# Patient Record
Sex: Female | Born: 1965
Health system: Southern US, Community
[De-identification: ages and names within clinical notes are randomized; demographics above are authoritative.]

## PROBLEM LIST (undated history)

## (undated) DIAGNOSIS — E119 Type 2 diabetes mellitus without complications: Secondary | ICD-10-CM

## (undated) DIAGNOSIS — I639 Cerebral infarction, unspecified: Secondary | ICD-10-CM

## (undated) DIAGNOSIS — E7800 Pure hypercholesterolemia, unspecified: Secondary | ICD-10-CM

## (undated) DIAGNOSIS — I10 Essential (primary) hypertension: Secondary | ICD-10-CM

## (undated) HISTORY — PX: TONSILLECTOMY: SUR1361

## (undated) HISTORY — PX: TUBAL LIGATION: SHX77

---

## 2000-05-20 ENCOUNTER — Other Ambulatory Visit: Admission: RE | Admit: 2000-05-20 | Discharge: 2000-05-20 | Payer: Self-pay | Admitting: Family Medicine

## 2011-01-29 ENCOUNTER — Encounter: Payer: Self-pay | Admitting: *Deleted

## 2011-01-29 ENCOUNTER — Emergency Department (HOSPITAL_BASED_OUTPATIENT_CLINIC_OR_DEPARTMENT_OTHER)
Admission: EM | Admit: 2011-01-29 | Discharge: 2011-01-29 | Disposition: A | Discharge status: Home / Self Care | Payer: BC Managed Care – PPO | Attending: Emergency Medicine | Admitting: Emergency Medicine

## 2011-01-29 DIAGNOSIS — M62838 Other muscle spasm: Secondary | ICD-10-CM

## 2011-01-29 DIAGNOSIS — M25519 Pain in unspecified shoulder: Secondary | ICD-10-CM | POA: Insufficient documentation

## 2011-01-29 DIAGNOSIS — I10 Essential (primary) hypertension: Secondary | ICD-10-CM | POA: Insufficient documentation

## 2011-01-29 DIAGNOSIS — M549 Dorsalgia, unspecified: Secondary | ICD-10-CM | POA: Insufficient documentation

## 2011-01-29 DIAGNOSIS — Z79899 Other long term (current) drug therapy: Secondary | ICD-10-CM | POA: Insufficient documentation

## 2011-01-29 HISTORY — DX: Essential (primary) hypertension: I10

## 2011-01-29 MED ORDER — IBUPROFEN 600 MG PO TABS: 600.0000 mg | ORAL_TABLET | Freq: Three times a day (TID) | ORAL | Status: AC | PRN

## 2011-01-29 MED ORDER — KETOROLAC TROMETHAMINE 60 MG/2ML IM SOLN
60.0000 mg | Freq: Once | INTRAMUSCULAR | Status: AC
  Administered 2011-01-29: 60 mg via INTRAMUSCULAR
  Filled 2011-01-29: qty 2

## 2011-01-29 MED ORDER — DIAZEPAM 5 MG PO TABS: 5.0000 mg | ORAL_TABLET | Freq: Two times a day (BID) | ORAL | Status: AC

## 2011-01-29 MED ORDER — HYDROCODONE-ACETAMINOPHEN 5-500 MG PO TABS: 1.0000 | ORAL_TABLET | Freq: Four times a day (QID) | ORAL | Status: AC | PRN

## 2011-01-29 NOTE — ED Provider Notes (Signed)
History     CSN: [id number] Arrival date & time: 01/29/2011  3:40 PM   First MD Initiated Contact with Patient 01/29/11 1542      Chief Complaint  Patient presents with  . Shoulder Pain    (Consider location/radiation/quality/duration/timing/severity/associated sxs/prior treatment) HPI Patient reports awakening from bed 2 days ago with a soreness in her left shoulder.  Has been constant since then.  She's not tried anything for the pain.  She denies neck pain.  She denies chest pain shortness of breath.  She denies weakness of her left upper extremity.  She has no numbness or tingling.  She reports her pain is worsened by palpation and movement of her left arm.  She's had no recent trauma.    Past Medical History  Diagnosis Date  . Hypertension     Past Surgical History  Procedure Date  . Tubal ligation     History reviewed. No pertinent family history.  History  Substance Use Topics  . Smoking status: Never Smoker   . Smokeless tobacco: Not on file  . Alcohol Use: No    OB History    Grav Para Term Preterm Abortions TAB SAB Ect Mult Living                  Review of Systems  All other systems reviewed and are negative.    Allergies  Review of patient's allergies indicates no known allergies.  Home Medications   Current Outpatient Rx  Name Route Sig Dispense Refill  . AMLODIPINE BESY-BENAZEPRIL HCL 10-20 MG PO CAPS Oral Take 1 capsule by mouth daily.      . ASPIRIN 325 MG PO TABS Oral Take 325 mg by mouth daily.      Marland Kitchen HYDROCHLOROTHIAZIDE 25 MG PO TABS Oral Take 25 mg by mouth daily.      Marland Kitchen DIAZEPAM 5 MG PO TABS Oral Take 1 tablet (5 mg total) by mouth 2 (two) times daily. 10 tablet 0  . HYDROCODONE-ACETAMINOPHEN 5-500 MG PO TABS Oral Take 1-2 tablets by mouth every 6 (six) hours as needed for pain. 15 tablet 0  . IBUPROFEN 600 MG PO TABS Oral Take 1 tablet (600 mg total) by mouth every 8 (eight) hours as needed for pain. 15 tablet 0    BP 152/72   Pulse 93  Temp(Src) 98.6 F (37 C) (Oral)  Resp 16  Ht 5\' 6"  (1.676 m)  Wt 240 lb (108.863 kg)  BMI 38.74 kg/m2  SpO2 100%  LMP 01/12/2011  Physical Exam  Nursing note and vitals reviewed. Constitutional: She is oriented to person, place, and time. She appears well-developed and well-nourished.  HENT:  Head: Normocephalic.  Eyes: EOM are normal.  Neck: Normal range of motion.  Cardiovascular: Normal rate.   Pulmonary/Chest: Effort normal.  Abdominal: Soft.  Musculoskeletal: Normal range of motion.       Patient with spasm of her left parathoracic region.  There is no overlying skin changes.  She is tender to palpation in this area and there is a notable spasm.  She has normal strength in her bilateral upper extremities.  She has normal range of motion of her left shoulder.  There is no bruising  Neurological: She is alert and oriented to person, place, and time.  Skin: Skin is warm and dry.  Psychiatric: She has a normal mood and affect.    ED Course  Procedures (including critical care time)  Labs Reviewed - No data to display No results  found.   1. Back pain   2. Muscle spasm       MDM  No trauma.  Patient has apparent muscle spasm.  Plan Toradol given in the emergency department she'll be discharged home on hydrocodone and muscle relaxants.  Close followup with her PCP is recommended        Lyanne Co, MD 01/29/11 1558

## 2011-01-29 NOTE — ED Notes (Signed)
Pt c/o left shoulder pain X 2 days

## 2016-02-14 ENCOUNTER — Encounter (HOSPITAL_BASED_OUTPATIENT_CLINIC_OR_DEPARTMENT_OTHER): Payer: Self-pay | Admitting: Emergency Medicine

## 2016-02-14 ENCOUNTER — Emergency Department (HOSPITAL_BASED_OUTPATIENT_CLINIC_OR_DEPARTMENT_OTHER)
Admission: EM | Admit: 2016-02-14 | Discharge: 2016-02-14 | Disposition: A | Discharge status: Home / Self Care | Payer: 59 | Attending: Emergency Medicine | Admitting: Emergency Medicine

## 2016-02-14 DIAGNOSIS — E119 Type 2 diabetes mellitus without complications: Secondary | ICD-10-CM | POA: Diagnosis not present

## 2016-02-14 DIAGNOSIS — Z7982 Long term (current) use of aspirin: Secondary | ICD-10-CM | POA: Diagnosis not present

## 2016-02-14 DIAGNOSIS — K029 Dental caries, unspecified: Secondary | ICD-10-CM | POA: Insufficient documentation

## 2016-02-14 DIAGNOSIS — K0889 Other specified disorders of teeth and supporting structures: Secondary | ICD-10-CM

## 2016-02-14 DIAGNOSIS — Z7984 Long term (current) use of oral hypoglycemic drugs: Secondary | ICD-10-CM | POA: Insufficient documentation

## 2016-02-14 DIAGNOSIS — I10 Essential (primary) hypertension: Secondary | ICD-10-CM | POA: Insufficient documentation

## 2016-02-14 HISTORY — DX: Pure hypercholesterolemia, unspecified: E78.00

## 2016-02-14 HISTORY — DX: Cerebral infarction, unspecified: I63.9

## 2016-02-14 HISTORY — DX: Type 2 diabetes mellitus without complications: E11.9

## 2016-02-14 MED ORDER — PENICILLIN V POTASSIUM 500 MG PO TABS: 500.0000 mg | ORAL_TABLET | Freq: Four times a day (QID) | ORAL | 0 refills | Status: AC

## 2016-02-14 MED ORDER — HYDROCODONE-ACETAMINOPHEN 5-325 MG PO TABS: 1.0000 | ORAL_TABLET | Freq: Four times a day (QID) | ORAL | 0 refills | Status: AC | PRN

## 2016-02-14 MED FILL — PENICILLIN VK 500 MG TABLET: 500 | 7 days supply | Qty: 28 | Fill #0

## 2016-02-14 MED FILL — HYDROCODON-APAP 5-325: 5-325 | 2 days supply | Qty: 10 | Fill #0

## 2016-02-14 NOTE — Discharge Instructions (Signed)
Take the penicillin antibiotic as directed for 7 days. Take the hydrocodone as needed for pain. Make an appointment to follow-up with your dentist.

## 2016-02-14 NOTE — ED Triage Notes (Signed)
R side back tooth ache x 2 days. Taking ibuprofen without relief. Last dose 6:00 this morning.

## 2016-02-14 NOTE — ED Provider Notes (Signed)
[facility] Provider [facility]   CSN: [id number] Arrival date & time: 02/14/16  [id number]     History   Chief Complaint Chief Complaint  Patient presents with  . Dental Pain    HPI Denise Meyer is a 50 y.o. female.  Patient with 2 day history of right side upper toothache. Patient with history of dental problems. Patient's been taking ibuprofen without relief. Patient without difficulty swallowing. No fevers. Tooth pain is 10 out of 10. Patient does have a dentist to follow-up with. Patient has a history of allergies to cephalosporins but is able to take penicillin.      Past Medical History:  Diagnosis Date  . Diabetes mellitus without complication (HCC)   . High cholesterol   . Hypertension   . Stroke [facility](HCC)     There are no active problems to display for this patient.   Past Surgical History:  Procedure Laterality Date  . TUBAL LIGATION      OB History    No data available       Home Medications    Prior to Admission medications   Medication Sig Start Date End Date Taking? Authorizing Provider  atorvastatin (LIPITOR) 10 MG tablet Take 10 mg by mouth daily.   Yes Historical Provider, MD  clopidogrel (PLAVIX) 75 MG tablet Take 75 mg by mouth daily.   Yes Historical Provider, MD  metFORMIN (GLUCOPHAGE) 500 MG tablet Take by mouth 2 (two) times daily with a meal.   Yes Historical Provider, MD  amLODipine-benazepril (LOTREL) 10-20 MG per capsule Take 1 capsule by mouth daily.      Historical Provider, MD  aspirin 325 MG tablet Take 325 mg by mouth daily.      Historical Provider, MD  hydrochlorothiazide (HYDRODIURIL) 25 MG tablet Take 25 mg by mouth daily.      Historical Provider, MD  HYDROcodone-acetaminophen (NORCO/VICODIN) 5-325 MG tablet Take 1-2 tablets by mouth every 6 (six) hours as needed for moderate pain. 02/14/16   Vanetta MuldersScott Lavon Horn, MD  penicillin v potassium (VEETID) 500 MG tablet Take 1 tablet (500 mg total) by mouth 4 (four) times daily.  02/14/16   Vanetta MuldersScott Jullianna Gabor, MD    Family History No family history on file.  Social History Social History  Substance Use Topics  . Smoking status: Never Smoker  . Smokeless tobacco: Never Used  . Alcohol use No     Allergies   Cephalosporins   Review of Systems Review of Systems  Constitutional: Negative for fever.  HENT: Positive for dental problem. Negative for facial swelling and trouble swallowing.   Eyes: Negative for redness.  Respiratory: Negative for shortness of breath.   Cardiovascular: Negative for chest pain.  Gastrointestinal: Negative for abdominal pain.  Musculoskeletal: Negative for neck stiffness.  Skin: Negative for rash.  Neurological: Negative for headaches.  Hematological: Does not bruise/bleed easily.  Psychiatric/Behavioral: Negative for confusion.     Physical Exam Updated Vital Signs BP 162/86 (BP Location: Left Arm)   Pulse 76   Temp 98.6 F (37 C) (Oral)   Resp 16   LMP 02/01/2016   SpO2 95%   Physical Exam  Constitutional: She is oriented to person, place, and time. She appears well-developed and well-nourished. No distress.  HENT:  Head: Normocephalic.  Mouth/Throat: Oropharynx is clear and moist. No oropharyngeal exudate.  Marked dental caries. Significant Cary in right upper molar. No significant facial swelling. No adenopathy. No trismus.  Eyes: Conjunctivae and EOM are normal. Pupils are equal,  round, and reactive to light.  Neck: Normal range of motion. Neck supple.  Cardiovascular: Normal rate, regular rhythm and normal heart sounds.   Pulmonary/Chest: Effort normal and breath sounds normal. No respiratory distress.  Abdominal: Soft. Bowel sounds are normal.  Lymphadenopathy:    She has no cervical adenopathy.  Neurological: She is alert and oriented to person, place, and time. No cranial nerve deficit.  Skin: Skin is warm.  Nursing note and vitals reviewed.    ED Treatments / Results  Labs (all labs ordered are  listed, but only abnormal results are displayed) Labs Reviewed - No data to display  EKG  EKG Interpretation None       Radiology No results found.  Procedures Procedures (including critical care time)  Medications Ordered in ED Medications - No data to display   Initial Impression / Assessment and Plan / ED Course  I have reviewed the triage vital signs and the nursing notes.  Pertinent labs & imaging results that were available during my care of the patient were reviewed by me and considered in my medical decision making (see chart for details).  Clinical Course     Patient with significant decay to right upper molar most likely source of the pain. Patient nontoxic no acute distress. Patient is able to take penicillin despite having allergy to cephalosporins. Will treat with Pen-Vee K and hydrocodone for the pain. Patient does have a dentist to follow-up with. Work note provided.  Final Clinical Impressions(s) / ED Diagnoses   Final diagnoses:  Toothache  Dental caries    New Prescriptions New Prescriptions   HYDROCODONE-ACETAMINOPHEN (NORCO/VICODIN) 5-325 MG TABLET    Take 1-2 tablets by mouth every 6 (six) hours as needed for moderate pain.   PENICILLIN V POTASSIUM (VEETID) 500 MG TABLET    Take 1 tablet (500 mg total) by mouth 4 (four) times daily.     Vanetta MuldersScott Laiana Fratus, MD 02/14/16 1005

## 2017-08-14 ENCOUNTER — Other Ambulatory Visit: Payer: Self-pay

## 2017-08-14 ENCOUNTER — Emergency Department (HOSPITAL_BASED_OUTPATIENT_CLINIC_OR_DEPARTMENT_OTHER)
Admission: EM | Admit: 2017-08-14 | Discharge: 2017-08-14 | Disposition: A | Discharge status: Home / Self Care | Payer: BLUE CROSS/BLUE SHIELD | Attending: Emergency Medicine | Admitting: Emergency Medicine

## 2017-08-14 ENCOUNTER — Encounter (HOSPITAL_BASED_OUTPATIENT_CLINIC_OR_DEPARTMENT_OTHER): Payer: Self-pay | Admitting: *Deleted

## 2017-08-14 DIAGNOSIS — E119 Type 2 diabetes mellitus without complications: Secondary | ICD-10-CM | POA: Insufficient documentation

## 2017-08-14 DIAGNOSIS — Z7984 Long term (current) use of oral hypoglycemic drugs: Secondary | ICD-10-CM | POA: Insufficient documentation

## 2017-08-14 DIAGNOSIS — L03012 Cellulitis of left finger: Secondary | ICD-10-CM

## 2017-08-14 DIAGNOSIS — Z7902 Long term (current) use of antithrombotics/antiplatelets: Secondary | ICD-10-CM | POA: Insufficient documentation

## 2017-08-14 DIAGNOSIS — Z7982 Long term (current) use of aspirin: Secondary | ICD-10-CM | POA: Insufficient documentation

## 2017-08-14 DIAGNOSIS — M79642 Pain in left hand: Secondary | ICD-10-CM | POA: Diagnosis present

## 2017-08-14 DIAGNOSIS — I10 Essential (primary) hypertension: Secondary | ICD-10-CM | POA: Diagnosis not present

## 2017-08-14 DIAGNOSIS — Z79899 Other long term (current) drug therapy: Secondary | ICD-10-CM | POA: Insufficient documentation

## 2017-08-14 MED ORDER — DOXYCYCLINE HYCLATE 100 MG PO CAPS: 100.0000 mg | ORAL_CAPSULE | Freq: Two times a day (BID) | ORAL | 0 refills | Status: AC

## 2017-08-14 MED ORDER — LIDOCAINE-EPINEPHRINE-TETRACAINE (LET) SOLUTION
3.0000 mL | Freq: Once | NASAL | Status: AC
  Administered 2017-08-14: 3 mL via TOPICAL
  Filled 2017-08-14: qty 3

## 2017-08-14 MED ORDER — NAPROXEN 375 MG PO TABS: 375.0000 mg | ORAL_TABLET | Freq: Two times a day (BID) | ORAL | 0 refills | Status: AC

## 2017-08-14 MED FILL — DOXYCYCLINE HYCLATE 100 MG: 100 | 5 days supply | Qty: 10 | Fill #0

## 2017-08-14 MED FILL — NAPROXEN 375 MG TABLET: 375 | 10 days supply | Qty: 20 | Fill #0

## 2017-08-14 NOTE — ED Provider Notes (Signed)
[facility] EMERGENCY DEPARTMENT Provider Note   CSN: [id number] Arrival date & time: 08/14/17  1518     History   Chief Complaint Chief Complaint  Patient presents with  . Hand Pain    HPI Denise Meyer is a 52 y.o. female with a hx of DM, HTN, hyperlipidemia, and stroke who presents to the ED for left index finger pain and swelling x 1 week. Patient states constant, progressive worsening pain. Rates pain an 8/10 in severity, worse with palpation, no other alleviating/aggravating factors. Has not tried medications at home. No hx of similar. Denies fever, chills, numbness, or weakness. No injury or change in activity. Patient is diabetic- glucose at home running 120-130 per patient.Patient is R hand dominant.  Patient reports tetanus is up to date.   Patient with hx of HTN, taking meds as prescribed- no HA, change in vision, numbness, weakness, chest pain, or dyspnea.   HPI  Past Medical History:  Diagnosis Date  . Diabetes mellitus without complication (HCC)   . High cholesterol   . Hypertension   . Stroke [facility](HCC)     There are no active problems to display for this patient.   Past Surgical History:  Procedure Laterality Date  . TUBAL LIGATION       OB History   None      Home Medications    Prior to Admission medications   Medication Sig Start Date End Date Taking? Authorizing Provider  amLODipine-benazepril (LOTREL) 10-20 MG per capsule Take 1 capsule by mouth daily.      [provider]  aspirin 325 MG tablet Take 325 mg by mouth daily.      [provider]  atorvastatin (LIPITOR) 10 MG tablet Take 10 mg by mouth daily.    [provider]  clopidogrel (PLAVIX) 75 MG tablet Take 75 mg by mouth daily.    [provider]  hydrochlorothiazide (HYDRODIURIL) 25 MG tablet Take 25 mg by mouth daily.      [provider]  HYDROcodone-acetaminophen (NORCO/VICODIN) 5-325 MG tablet Take 1-2 tablets by mouth every 6  (six) hours as needed for moderate pain. 02/14/16   Vanetta MuldersZackowski, Scott, MD  metFORMIN (GLUCOPHAGE) 500 MG tablet Take by mouth 2 (two) times daily with a meal.    [provider]  penicillin v potassium (VEETID) 500 MG tablet Take 1 tablet (500 mg total) by mouth 4 (four) times daily. 02/14/16   Vanetta MuldersZackowski, Scott, MD    Family History History reviewed. No pertinent family history.  Social History Social History   Tobacco Use  . Smoking status: Never Smoker  . Smokeless tobacco: Never Used  Substance Use Topics  . Alcohol use: No  . Drug use: No     Allergies   Cephalosporins   Review of Systems Review of Systems  Constitutional: Negative for chills and fever.  Eyes: Negative for visual disturbance.  Respiratory: Negative for shortness of breath.   Cardiovascular: Negative for chest pain.  Musculoskeletal:       Positive for L index finger pain/swelling   Neurological: Negative for weakness, numbness and headaches.   Physical Exam Updated Vital Signs BP (!) 172/81 (BP Location: Left Arm)   Pulse 89   Temp 98.6 F (37 C) (Oral)   Resp 18   Ht 5\' 6"  (1.676 m)   Wt 108.9 kg (240 lb)   LMP 07/31/2017   SpO2 100%   BMI 38.74 kg/m   Physical Exam  Constitutional: She appears  well-developed and well-nourished. No distress.  HENT:  Head: Normocephalic and atraumatic.  Eyes: Conjunctivae are normal. Right eye exhibits no discharge. Left eye exhibits no discharge.  Cardiovascular:  Pulses:      Radial pulses are 2+ on the right side, and 2+ on the left side.  Musculoskeletal:  L 2nd digit: There is some soft tissue swelling to the lateral (radial aspect) of the nail with palpable fluctuance just proximal to the cuticle on this side. This area is tender to palpation. Mild overlying erythema. Swelling/erythema extend to lateral aspect of the finger, does not extend to the pulp. The pulp of the finger is soft, no overlying erythema/induration/fluctuance to the pulp.    Upper extremities: no obvious deformities. Patient has normal ROM to bilateral wrists and all digits including L 2nd digit. Other than what is noted to above, nontender to palpation.   Neurological: She is alert.  Clear speech. Sensation grossly intact to bilateral upper extremities. 5/5 symmetric grip strength. Able to perform OK sign, thumbs up, and cross 2nd/3rd digits.   Skin: Capillary refill takes less than 2 seconds.  Psychiatric: She has a normal mood and affect. Her behavior is normal. Thought content normal.  Nursing note and vitals reviewed.   ED Treatments / Results  Labs (all labs ordered are listed, but only abnormal results are displayed) Labs Reviewed - No data to display  EKG None  Radiology No results found.  Procedures .Marland KitchenIncision and Drainage Date/Time: 08/14/2017 4:25 PM Performed by: Cherly Anderson, PA-C Authorized by: Cherly Anderson, PA-C   Consent:    Consent obtained:  Verbal   Consent given by:  Patient   Risks discussed:  Bleeding, incomplete drainage, infection, pain and damage to other organs   Alternatives discussed:  No treatment and alternative treatment Location:    Type:  Abscess (paronychia)   Location:  Upper extremity   Upper extremity location:  Finger   Finger location:  L index finger Pre-procedure details:    Skin preparation:  Betadine Anesthesia (see MAR for exact dosages):    Anesthesia method:  Topical application   Topical anesthetic:  LET Procedure type:    Complexity:  Simple Procedure details:    Incision types:  Stab incision   Scalpel blade:  11   Drainage:  Purulent   Drainage amount:  Moderate   Wound treatment:  Wound left open   Packing materials:  None Post-procedure details:    Patient tolerance of procedure:  Tolerated well, no immediate complications   (including critical care time)  Medications Ordered in ED Medications  lidocaine-EPINEPHrine-tetracaine (LET) solution (3 mLs Topical  Given 08/14/17 1545)   Initial Impression / Assessment and Plan / ED Course  I have reviewed the triage vital signs and the nursing notes.  Pertinent labs & imaging results that were available during my care of the patient were reviewed by me and considered in my medical decision making (see chart for details).   Patient presents with H&P consistent with paronychia to the radial aspect of the L 2nd digit. No appreciable felon on exam. I&D performed per procedure note above. Moderate amount of drainage. Will place on short course of doxycycline. Recommended warm soaks/compresses. Will given naproxen for pain, last creatinine on chart review WNL. Will have patient follow up with PCP for recheck of digit as well as BP recheck in 2-3 days given BP elevated in the ER (has been elevated during past ER visits)- doubt HTN emergency, she is aware of  need for recheck. I discussed treatment plan, need for PCP follow-up, and return precautions with the patient. Provided opportunity for questions, patient confirmed understanding and is in agreement with plan.    Final Clinical Impressions(s) / ED Diagnoses   Final diagnoses:  Paronychia of finger, left    ED Discharge Orders        Ordered    naproxen (NAPROSYN) 375 MG tablet  2 times daily     08/14/17 1631    doxycycline (VIBRAMYCIN) 100 MG capsule  2 times daily     08/14/17 1631       Petrucelli, [place] R, PA-C 08/14/17 1743    Little, Ambrose Finland, MD 08/14/17 1755

## 2017-08-14 NOTE — ED Triage Notes (Signed)
Pt c/o left index finger pain and redness x 1 week

## 2017-08-14 NOTE — Discharge Instructions (Signed)
You were seen in the emergency department for pain and swelling to your left index finger.  You have a paronychia of this finger which was incised and drained, please see the attached handout regarding further information for this diagnosis.  We are also start you on doxycycline, and antibiotic, to treat the infection as well.  Please take this as prescribed. Please take all of your antibiotics until finished. You may develop abdominal discomfort or diarrhea from the antibiotic.  You may help offset this with probiotics which you can buy at the store (ask your pharmacist if unable to find) or get probiotics in the form of eating yogurt. Do not eat or take the probiotics until 2 hours after your antibiotic. If you are unable to tolerate these side effects follow-up with your primary care provider or return to the emergency department.   If you begin to experience any blistering, rashes, swelling, or difficulty breathing seek medical care for evaluation of potentially more serious side effects.   Please be aware that this medication may interact with other medications you are taking, please be sure to discuss your medication list with your pharmacist.   We are sending you home with a prescription for naproxen for pain. Naproxen is a nonsteroidal anti-inflammatory medication that will help with pain and swelling. Be sure to take this medication as prescribed with food, 1 pill every 12 hours,  It should be taken with food, as it can cause stomach upset, and more seriously, stomach bleeding. Do not take other nonsteroidal anti-inflammatory medications with this such as Advil, Motrin, or Aleve.    Additionally apply warm compresses or utilize warm soaks to 5 times per day.  Follow-up with your primary care provider in 2 to 3 days for recheck of this area and for a recheck of your blood pressure as it was elevated in the emergency department today.  Return to the ER for new or worsening symptoms including but not  limited to spreading redness, fevers, vomiting, or any other concerns.

## 2020-07-05 ENCOUNTER — Emergency Department (HOSPITAL_BASED_OUTPATIENT_CLINIC_OR_DEPARTMENT_OTHER): Payer: Worker's Compensation

## 2020-07-05 ENCOUNTER — Encounter (HOSPITAL_BASED_OUTPATIENT_CLINIC_OR_DEPARTMENT_OTHER): Payer: Self-pay | Admitting: *Deleted

## 2020-07-05 ENCOUNTER — Other Ambulatory Visit: Payer: Self-pay

## 2020-07-05 ENCOUNTER — Emergency Department (HOSPITAL_BASED_OUTPATIENT_CLINIC_OR_DEPARTMENT_OTHER)
Admission: EM | Admit: 2020-07-05 | Discharge: 2020-07-05 | Disposition: A | Discharge status: Home / Self Care | Payer: Worker's Compensation | Attending: Emergency Medicine | Admitting: Emergency Medicine

## 2020-07-05 DIAGNOSIS — I10 Essential (primary) hypertension: Secondary | ICD-10-CM | POA: Diagnosis not present

## 2020-07-05 DIAGNOSIS — Z7984 Long term (current) use of oral hypoglycemic drugs: Secondary | ICD-10-CM | POA: Insufficient documentation

## 2020-07-05 DIAGNOSIS — Z7982 Long term (current) use of aspirin: Secondary | ICD-10-CM | POA: Insufficient documentation

## 2020-07-05 DIAGNOSIS — Y92410 Unspecified street and highway as the place of occurrence of the external cause: Secondary | ICD-10-CM | POA: Diagnosis not present

## 2020-07-05 DIAGNOSIS — S161XXA Strain of muscle, fascia and tendon at neck level, initial encounter: Secondary | ICD-10-CM | POA: Insufficient documentation

## 2020-07-05 DIAGNOSIS — E119 Type 2 diabetes mellitus without complications: Secondary | ICD-10-CM | POA: Diagnosis not present

## 2020-07-05 DIAGNOSIS — Z79899 Other long term (current) drug therapy: Secondary | ICD-10-CM | POA: Insufficient documentation

## 2020-07-05 DIAGNOSIS — S199XXA Unspecified injury of neck, initial encounter: Secondary | ICD-10-CM | POA: Diagnosis present

## 2020-07-05 MED ORDER — LIDOCAINE 5 % EX PTCH: 1.0000 | MEDICATED_PATCH | CUTANEOUS | Status: DC

## 2020-07-05 MED ORDER — LIDOCAINE 5 % EX PTCH: 1.0000 | MEDICATED_PATCH | CUTANEOUS | 0 refills | Status: AC

## 2020-07-05 MED ORDER — LIDOCAINE 5 % EX PTCH
1.0000 | MEDICATED_PATCH | CUTANEOUS | Status: DC
  Administered 2020-07-05: 1 via TRANSDERMAL
  Filled 2020-07-05: qty 1
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
## 2020-07-05 NOTE — ED Notes (Addendum)
Pt requested 2 lidocaine patches - PA advised

## 2021-04-24 ENCOUNTER — Ambulatory Visit: Payer: 59 | Admitting: Family Medicine

## 2021-08-10 NOTE — Progress Notes (Addendum)
[facility] - Clinic Note  08/13/2021     CHIEF COMPLAINT Patient presents for Retina Evaluation   HISTORY OF PRESENT ILLNESS: Denise Meyer is a 56 y.o. female who presents to the clinic today for:   HPI     Retina Evaluation   In both eyes.  This started 6 years ago.  Duration of years.  Associated Symptoms Negative for Flashes, Floaters, Distortion and Blind Spot.  I, the attending physician,  performed the HPI with the patient and updated documentation appropriately.        Comments   Patient is here based on a referral from Dr. Adrian Prince for a diabetic exam. Patient has no complaints at this time. Her blood sugar was 99 this morning and her A1c is 6.4.      Last edited by Rennis Chris, MD on 08/15/2021  1:19 PM.    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  HISTORICAL INFORMATION:   Selected notes from the MEDICAL RECORD NUMBER Referred by Dr. Ermalene Searing for DM exam LEE:  Ocular Hx- PMH-    CURRENT MEDICATIONS: Current Outpatient Medications (Ophthalmic Drugs)  Medication Sig   dorzolamide-timolol (COSOPT) 22.3-6.8 MG/ML ophthalmic solution Place 1 drop into both eyes 2 (two) times daily.   No current facility-administered medications for this visit. (Ophthalmic Drugs)   Current Outpatient Medications (Other)  Medication Sig   amLODipine-benazepril (LOTREL) 10-20 MG per capsule Take 1 capsule by mouth daily.     aspirin 325 MG tablet Take 325 mg by mouth daily.     aspirin 81 MG chewable tablet Chew by mouth.   atorvastatin (LIPITOR) 10 MG tablet Take 10 mg by mouth daily.   benazepril (LOTENSIN) 40 MG tablet Take 1 tablet by mouth  daily.   clopidogrel (PLAVIX) 75 MG tablet Take 75 mg by mouth daily.   clopidogrel (PLAVIX) 75 MG tablet Take 1 tablet by mouth daily.   hydrochlorothiazide (HYDRODIURIL) 25 MG tablet Take 25 mg by mouth daily.     metFORMIN (GLUCOPHAGE) 500 MG tablet Take by mouth 2 (two) times daily with a meal.   OZEMPIC, 2 MG/DOSE, 8 MG/3ML SOPN SMARTSIG:0.75 Milliliter(s) SUB-Q Once a Week   TOUJEO SOLOSTAR 300 UNIT/ML Solostar Pen SMARTSIG:50 Unit(s) SUB-Q Every Night   doxycycline (VIBRAMYCIN) 100 MG capsule Take 1 capsule (100 mg total) by mouth 2 (two) times daily. (Patient not taking: Reported on 08/13/2021)   HYDROcodone-acetaminophen (NORCO/VICODIN) 5-325 MG tablet Take 1-2 tablets by mouth every 6 (six) hours as needed for moderate pain. (Patient not taking: Reported on 08/13/2021)   lidocaine (LIDODERM) 5 % Place 1 patch onto the skin daily. Remove & Discard patch within 12 hours or as directed by MD (Patient not taking: Reported on 08/13/2021)   naproxen (NAPROSYN) 375 MG tablet Take 1 tablet (375 mg total) by mouth 2 (two) times daily. (Patient not taking: Reported on 08/13/2021)   penicillin v potassium (VEETID) 500 MG tablet Take 1 tablet (500 mg total) by mouth 4 (four) times daily. (Patient not taking: Reported on 08/13/2021)   No current facility-administered medications for this visit. (Other)   REVIEW OF SYSTEMS: ROS  Positive for: Endocrine, Eyes Last edited by Julieanne Cotton, COT on 08/13/2021  9:36 AM.     ALLERGIES Allergies  Allergen Reactions   Cephalosporins    PAST MEDICAL HISTORY Past Medical History:  Diagnosis Date   Diabetes mellitus without complication (HCC)    High cholesterol    Hypertension    Stroke [facility])    Past Surgical History:  Procedure Laterality Date   TONSILLECTOMY     TUBAL LIGATION     FAMILY HISTORY History reviewed. No pertinent family history.  SOCIAL HISTORY Social History   Tobacco Use   Smoking status: Never   Smokeless tobacco:  Never  Substance Use Topics   Alcohol use: No   Drug use: No       OPHTHALMIC EXAM:  Base Eye Exam     Visual Acuity (Snellen - Linear)       Right Left   Dist cc 20/30 20/25   Dist ph cc 20/20 NI    Correction: Glasses         Tonometry (Tonopen, 9:47 AM)       Right Left   Pressure 30 22         Tonometry #2 (Tonopen, 9:57 AM)       Right Left   Pressure 25 20         Tonometry Comments   Family history glc - mother  Hx of glc - self used Timolol and then was told to stop.  1 drop of Brimonidine OU @ 9:48a 1 drop of Cosopt OU @ 9:51a        Pupils       Pupils Dark Light Shape React APD   Right PERRL 2 1 Round Minimal None   Left PERRL 2 1 Round Minimal None         Visual Fields       Left Right    Full Full         Extraocular Movement       Right Left    Full, Ortho Full, Ortho         Neuro/Psych     Oriented x3: Yes         Dilation     Both eyes: 1.0% Mydriacyl @ 9:40 AM           Slit Lamp and Fundus Exam     Slit Lamp Exam       Right Left   Lids/Lashes Dermatochalasis - upper lid Dermatochalasis - upper lid   Conjunctiva/Sclera White and quiet White and quiet   Cornea trace PEE trace PEE   Anterior Chamber Deep and quiet Deep and quiet   Iris Round and dilated, No NVI Round and dilated, No NVI   Lens 2+ Nuclear sclerosis, 2+ Cortical cataract 2+ Nuclear sclerosis, 2+ Cortical cataract   Anterior Vitreous mild syneresis mild syneresis         Fundus Exam       Right Left   Disc Pink and Sharp, +cupping Pink and Sharp, +cupping   C/D Ratio 0.7 0.6   Macula Flat, Good foveal reflex, No heme or edema Flat, Good foveal reflex, RPE mottling, No heme or edema   Vessels attenuated, Tortuous, mild AV crossing changes attenuated, Tortuous, mild AV crossing changes, mild copper wiring   Periphery Attached Attached           Refraction     Wearing Rx       Sphere Cylinder Axis  Add   Right -1.75  +0.50 105 +1.50   Left -1.50 +0.25 106 +1.50         Manifest Refraction       Sphere Cylinder Axis Dist VA Add   Right -2.00 +0.50 105 20/20 +1.50   Left -1.50 +0.25 106  +1.50           IMAGING AND PROCEDURES  Imaging and Procedures for 08/13/2021  OCT, Retina - OU - Both Eyes       Right Eye Quality was good. Central Foveal Thickness: 237. Progression has no prior data. Findings include normal foveal contour, no IRF, no SRF, vitreomacular adhesion .   Left Eye Quality was good. Central Foveal Thickness: 235. Progression has no prior data. Findings include normal foveal contour, no IRF, no SRF, vitreomacular adhesion .   Notes *Images captured and stored on drive  Diagnosis / Impression:  NFP, no IRF/SRF OU No DME OU  Clinical management:  See below  Abbreviations: NFP - Normal foveal profile. CME - cystoid macular edema. PED - pigment epithelial detachment. IRF - intraretinal fluid. SRF - subretinal fluid. EZ - ellipsoid zone. ERM - epiretinal membrane. ORA - outer retinal atrophy. ORT - outer retinal tubulation. SRHM - subretinal hyper-reflective material. IRHM - intraretinal hyper-reflective material            ASSESSMENT/PLAN:    ICD-10-CM   1. Diabetes mellitus type 2 without retinopathy (HCC)  E11.9 OCT, Retina - OU - Both Eyes    2. Essential hypertension  I10     3. Hypertensive retinopathy of both eyes  H35.033     4. 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 Glaucoma suspect of both eyes  H40.003      Diabetes mellitus, type 2 without retinopathy OU  - last A1c was 6.3 on 10.26.22 - The incidence, risk factors for progression, natural history and treatment options for diabetic retinopathy  were discussed with patient.   - The need for close monitoring of blood glucose, blood pressure, and serum lipids, avoiding cigarette or any type of tobacco, and the need for long term follow up was also discussed with patient. - f/u in 1 year, sooner  prn  2,3. Hypertensive retinopathy OU - discussed importance of tight BP control - monitor  4,5. Ocular Hypertension / glaucoma suspect OU  - pt has +family hx of glaucoma  - pt states she used to be on timolol, but was told to stop  - IOP today: 30,22 (25,20 - after 1 drop brimonidine and 1 drop Cosopt)  - will start Cosopt BID OU  - f/u 6 weeks, DFE, OCT, HVF 24-2  Ophthalmic Meds Ordered this visit:  Meds ordered this encounter  Medications   dorzolamide-timolol (COSOPT) 22.3-6.8 MG/ML ophthalmic solution    Sig: Place 1 drop into both eyes 2 (two) times daily.    Dispense:  10 mL    Refill:  3     Return in about 6 weeks (around 09/24/2021) for f/u IOP check, DFE, OCT, HVF 24-2.  There are no Patient Instructions on file for this visit.  Explained the diagnoses, plan, and follow up with the patient and they expressed understanding.  Patient expressed understanding of the importance of proper follow up care.   This document serves as a record of services personally performed by Karie ChimeraBrian G. Keaston Pile, MD, PhD. It was created on their behalf by Glee ArvinAmanda J. Manson PasseyBrown, OA an ophthalmic technician. The creation of this record  is the provider's dictation and/or activities during the visit.    Electronically signed by: Glee Arvin. Manson Passey, [place] 06.02.2023 1:28 PM  Karie Chimera, M.D., Ph.D. Diseases & Surgery of the Retina and Vitreous [facility]  I have reviewed the above documentation for accuracy and completeness, and I agree with the above. Karie Chimera, M.D., Ph.D. 08/15/21 1:28 PM  Abbreviations: M myopia (nearsighted); A astigmatism; H hyperopia (farsighted); P presbyopia; Mrx spectacle prescription;  CTL contact lenses; OD right eye; OS left eye; OU both eyes  XT exotropia; ET esotropia; PEK punctate epithelial keratitis; PEE punctate epithelial erosions; DES dry eye syndrome; MGD meibomian gland dysfunction; ATs artificial tears; PFAT's preservative free  artificial tears; NSC nuclear sclerotic cataract; PSC posterior subcapsular cataract; ERM epi-retinal membrane; PVD posterior vitreous detachment; RD retinal detachment; DM diabetes mellitus; DR diabetic retinopathy; NPDR non-proliferative diabetic retinopathy; PDR proliferative diabetic retinopathy; CSME clinically significant macular edema; DME diabetic macular edema; dbh dot blot hemorrhages; CWS cotton wool spot; POAG primary open angle glaucoma; C/D cup-to-disc ratio; HVF humphrey visual field; GVF goldmann visual field; OCT optical coherence tomography; IOP intraocular pressure; BRVO Branch retinal vein occlusion; CRVO central retinal vein occlusion; CRAO central retinal artery occlusion; BRAO branch retinal artery occlusion; RT retinal tear; SB scleral buckle; PPV pars plana vitrectomy; VH Vitreous hemorrhage; PRP panretinal laser photocoagulation; IVK intravitreal kenalog; VMT vitreomacular traction; MH Macular hole;  NVD neovascularization of the disc; NVE neovascularization elsewhere; AREDS age related eye disease study; ARMD age related macular degeneration; POAG primary open angle glaucoma; EBMD epithelial/anterior basement membrane dystrophy; ACIOL anterior chamber intraocular lens; IOL intraocular lens; PCIOL posterior chamber intraocular lens; Phaco/IOL phacoemulsification with intraocular lens placement; PRK photorefractive keratectomy; LASIK laser assisted in situ keratomileusis; HTN hypertension; DM diabetes mellitus; COPD chronic obstructive pulmonary disease

## 2021-08-13 ENCOUNTER — Ambulatory Visit (INDEPENDENT_AMBULATORY_CARE_PROVIDER_SITE_OTHER): Payer: 59 | Admitting: Ophthalmology

## 2021-08-13 ENCOUNTER — Encounter (INDEPENDENT_AMBULATORY_CARE_PROVIDER_SITE_OTHER): Payer: Self-pay | Admitting: Ophthalmology

## 2021-08-13 DIAGNOSIS — H40053 Ocular hypertension, bilateral: Secondary | ICD-10-CM | POA: Diagnosis not present

## 2021-08-13 DIAGNOSIS — H40003 Preglaucoma, unspecified, bilateral: Secondary | ICD-10-CM

## 2021-08-13 DIAGNOSIS — I10 Essential (primary) hypertension: Secondary | ICD-10-CM

## 2021-08-13 DIAGNOSIS — H35033 Hypertensive retinopathy, bilateral: Secondary | ICD-10-CM

## 2021-08-13 DIAGNOSIS — E119 Type 2 diabetes mellitus without complications: Secondary | ICD-10-CM

## 2021-08-13 DIAGNOSIS — H3581 Retinal edema: Secondary | ICD-10-CM

## 2021-08-13 MED ORDER — DORZOLAMIDE HCL-TIMOLOL MAL 2-0.5 % OP SOLN: 1.0000 [drp] | Freq: Two times a day (BID) | OPHTHALMIC | 3 refills | Status: AC

## 2021-08-15 ENCOUNTER — Encounter (INDEPENDENT_AMBULATORY_CARE_PROVIDER_SITE_OTHER): Payer: Self-pay | Admitting: Ophthalmology

## 2021-09-24 ENCOUNTER — Encounter (INDEPENDENT_AMBULATORY_CARE_PROVIDER_SITE_OTHER): Payer: 59 | Admitting: Ophthalmology

## 2021-09-24 DIAGNOSIS — H40053 Ocular hypertension, bilateral: Secondary | ICD-10-CM

## 2021-09-24 DIAGNOSIS — E119 Type 2 diabetes mellitus without complications: Secondary | ICD-10-CM

## 2021-09-24 DIAGNOSIS — I10 Essential (primary) hypertension: Secondary | ICD-10-CM

## 2021-09-24 DIAGNOSIS — H35033 Hypertensive retinopathy, bilateral: Secondary | ICD-10-CM

## 2021-09-24 DIAGNOSIS — H40003 Preglaucoma, unspecified, bilateral: Secondary | ICD-10-CM
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
